# Patient Record
Sex: Male | Born: 1975 | Race: White | Hispanic: No | Marital: Single | Smoking: Former smoker
Health system: Southern US, Community
[De-identification: ages and names within clinical notes are randomized; demographics above are authoritative.]

## PROBLEM LIST (undated history)

## (undated) DIAGNOSIS — E119 Type 2 diabetes mellitus without complications: Secondary | ICD-10-CM

## (undated) HISTORY — PX: KNEE SURGERY: SHX244

## (undated) HISTORY — PX: TONSILLECTOMY: SUR1361

---

## 2009-08-14 ENCOUNTER — Emergency Department (HOSPITAL_COMMUNITY): Admission: EM | Admit: 2009-08-14 | Discharge: 2009-08-14 | Payer: Self-pay | Admitting: Emergency Medicine

## 2011-01-27 LAB — CK TOTAL AND CKMB (NOT AT ARMC)
CK, MB: 2.2 ng/mL (ref 0.3–4.0)
Relative Index: 1.4 (ref 0.0–2.5)

## 2011-01-27 LAB — COMPREHENSIVE METABOLIC PANEL
ALT: 133 U/L — ABNORMAL HIGH (ref 0–53)
Calcium: 10.2 mg/dL (ref 8.4–10.5)
Chloride: 106 mEq/L (ref 96–112)
GFR calc Af Amer: >60 mL/min (ref >60)

## 2011-01-27 LAB — D-DIMER, QUANTITATIVE: D-Dimer, Quant: <0.22 ug/mL-FEU (ref 0.00–0.48)

## 2011-01-27 LAB — CBC
Hemoglobin: 16.4 g/dL (ref 13.0–17.0)
MCHC: 35 g/dL (ref 30.0–36.0)
MCV: 88 fL (ref 78.0–100.0)

## 2011-01-27 LAB — TROPONIN I: Troponin I: <0.01 ng/mL (ref 0.00–0.06)

## 2011-01-27 LAB — DIFFERENTIAL: Eosinophils Relative: 3 % (ref 0–5)

## 2011-02-03 ENCOUNTER — Emergency Department (HOSPITAL_BASED_OUTPATIENT_CLINIC_OR_DEPARTMENT_OTHER)
Admission: EM | Admit: 2011-02-03 | Discharge: 2011-02-03 | Disposition: A | Discharge status: Home / Self Care | Payer: 59 | Attending: Emergency Medicine | Admitting: Emergency Medicine

## 2011-02-03 ENCOUNTER — Emergency Department (INDEPENDENT_AMBULATORY_CARE_PROVIDER_SITE_OTHER): Payer: 59

## 2011-02-03 DIAGNOSIS — R079 Chest pain, unspecified: Secondary | ICD-10-CM

## 2011-02-03 DIAGNOSIS — R0989 Other specified symptoms and signs involving the circulatory and respiratory systems: Secondary | ICD-10-CM | POA: Insufficient documentation

## 2011-02-03 DIAGNOSIS — I10 Essential (primary) hypertension: Secondary | ICD-10-CM | POA: Insufficient documentation

## 2011-02-03 DIAGNOSIS — I517 Cardiomegaly: Secondary | ICD-10-CM | POA: Insufficient documentation

## 2011-02-03 DIAGNOSIS — R0609 Other forms of dyspnea: Secondary | ICD-10-CM | POA: Insufficient documentation

## 2011-02-03 LAB — BASIC METABOLIC PANEL
CO2: 25 mEq/L (ref 19–32)
Chloride: 105 mEq/L (ref 96–112)
GFR calc non Af Amer: >60 mL/min (ref >60)
Glucose, Bld: 115 mg/dL — ABNORMAL HIGH (ref 70–99)
Potassium: 4.5 mEq/L (ref 3.5–5.1)
Sodium: 145 mEq/L (ref 135–145)

## 2011-02-03 LAB — CBC
HCT: 47.8 % (ref 39.0–52.0)
Hemoglobin: 16.8 g/dL (ref 13.0–17.0)
MCH: 29.4 pg (ref 26.0–34.0)
MCHC: 35.1 g/dL (ref 30.0–36.0)
RBC: 5.72 MIL/uL (ref 4.22–5.81)
RDW: 13.9 % (ref 11.5–15.5)
WBC: 6.8 10*3/uL (ref 4.0–10.5)

## 2011-02-03 LAB — DIFFERENTIAL
Basophils Absolute: 0 10*3/uL (ref 0.0–0.1)
Basophils Relative: 0 % (ref 0–1)
Eosinophils Absolute: 0.2 10*3/uL (ref 0.0–0.7)
Lymphocytes Relative: 25 % (ref 12–46)
Lymphs Abs: 1.7 10*3/uL (ref 0.7–4.0)
Monocytes Absolute: 0.6 10*3/uL (ref 0.1–1.0)
Monocytes Relative: 9 % (ref 3–12)
Neutrophils Relative %: 63 % (ref 43–77)

## 2011-02-03 LAB — POCT CARDIAC MARKERS
CKMB, poc: <1 ng/mL — ABNORMAL LOW (ref 1.0–8.0)
Myoglobin, poc: 64.5 ng/mL (ref 12–200)
Myoglobin, poc: 72.6 ng/mL (ref 12–200)
Troponin i, poc: <0.05 ng/mL (ref 0.00–0.09)

## 2011-12-07 IMAGING — CR DG CHEST 2V
2 series · 2 of 2 positions shown · non-contrast
Comparison: 08/14/2009

CLINICAL DATA: Right-sided chest pain radiating into the right arm.
Nonsmoker

CHEST - 2 VIEW

[w chest pa]
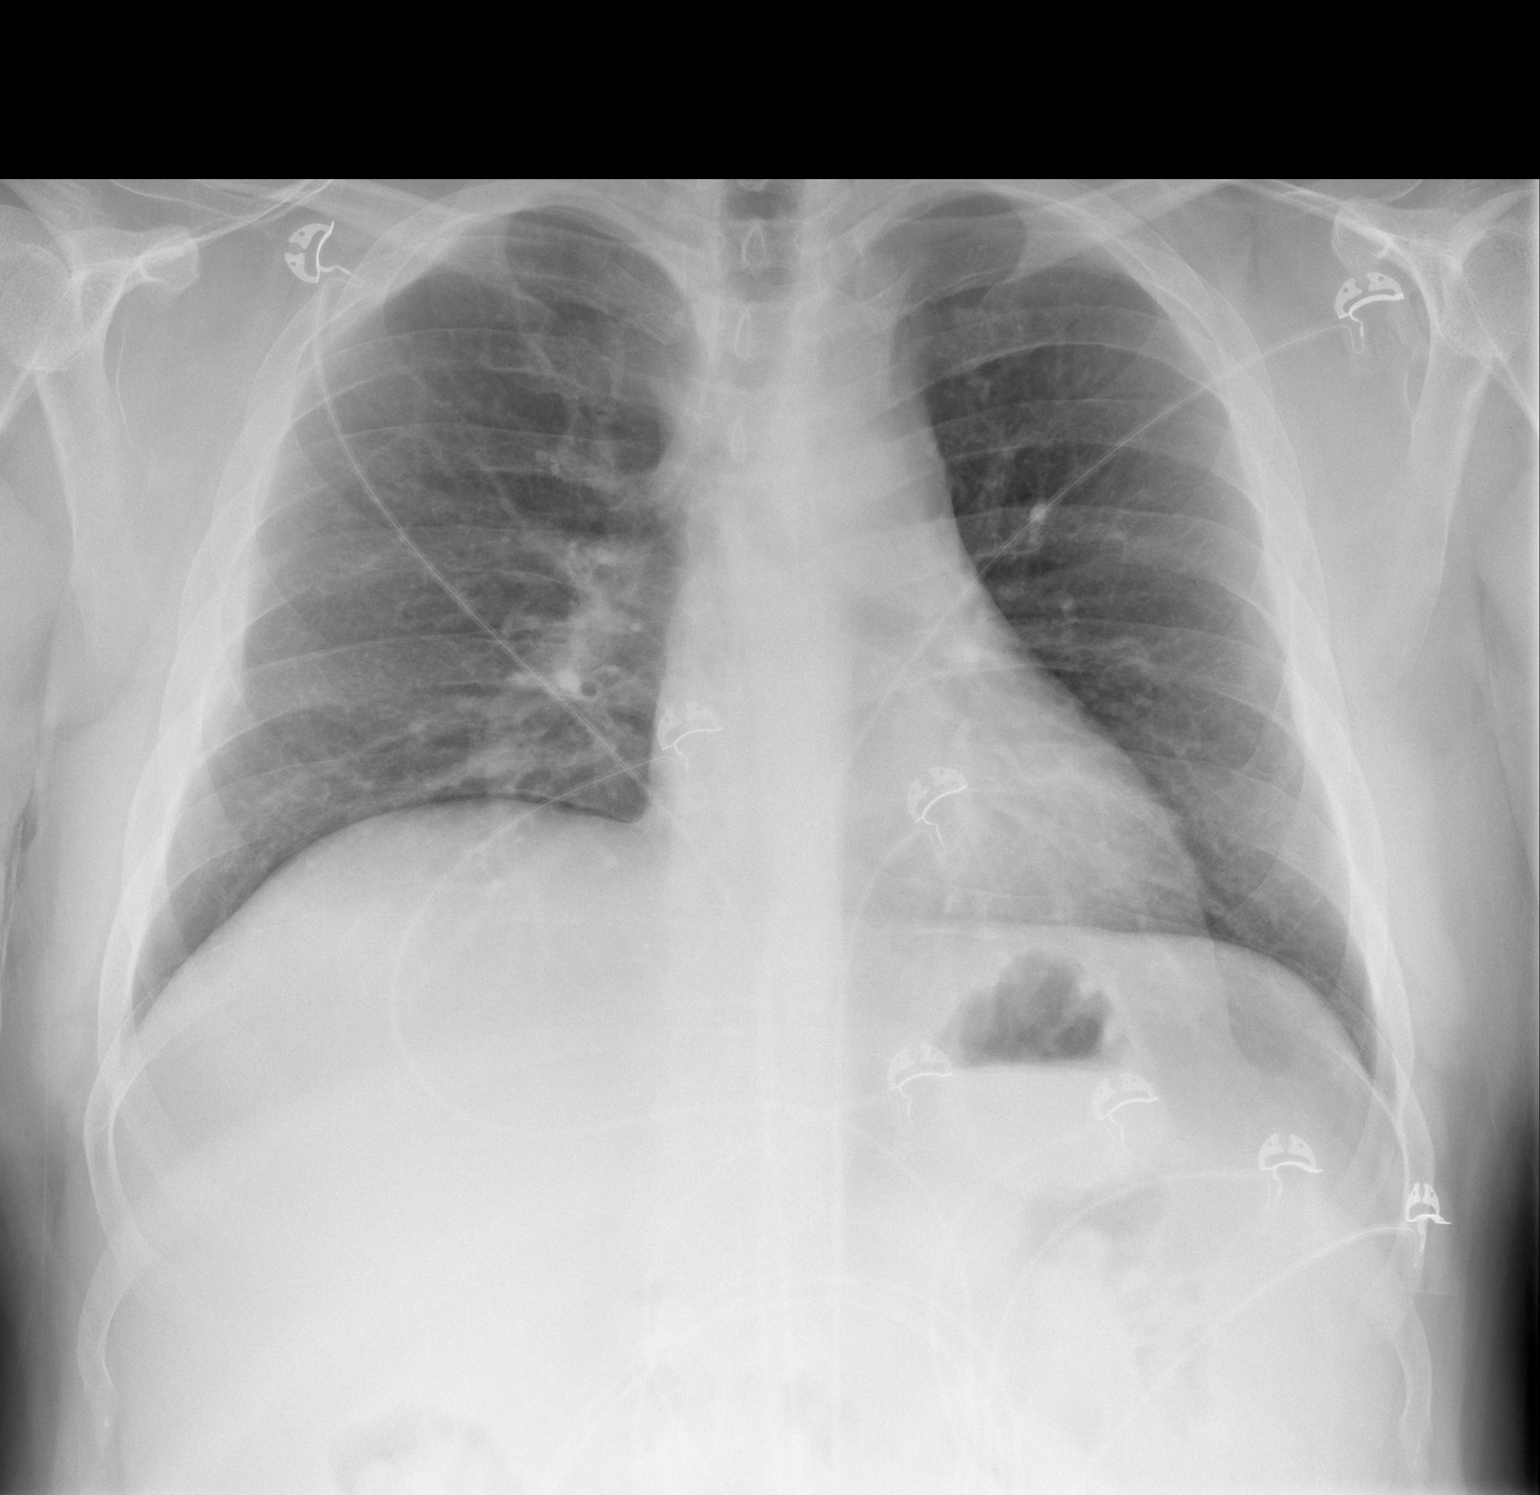

[w chest lat]
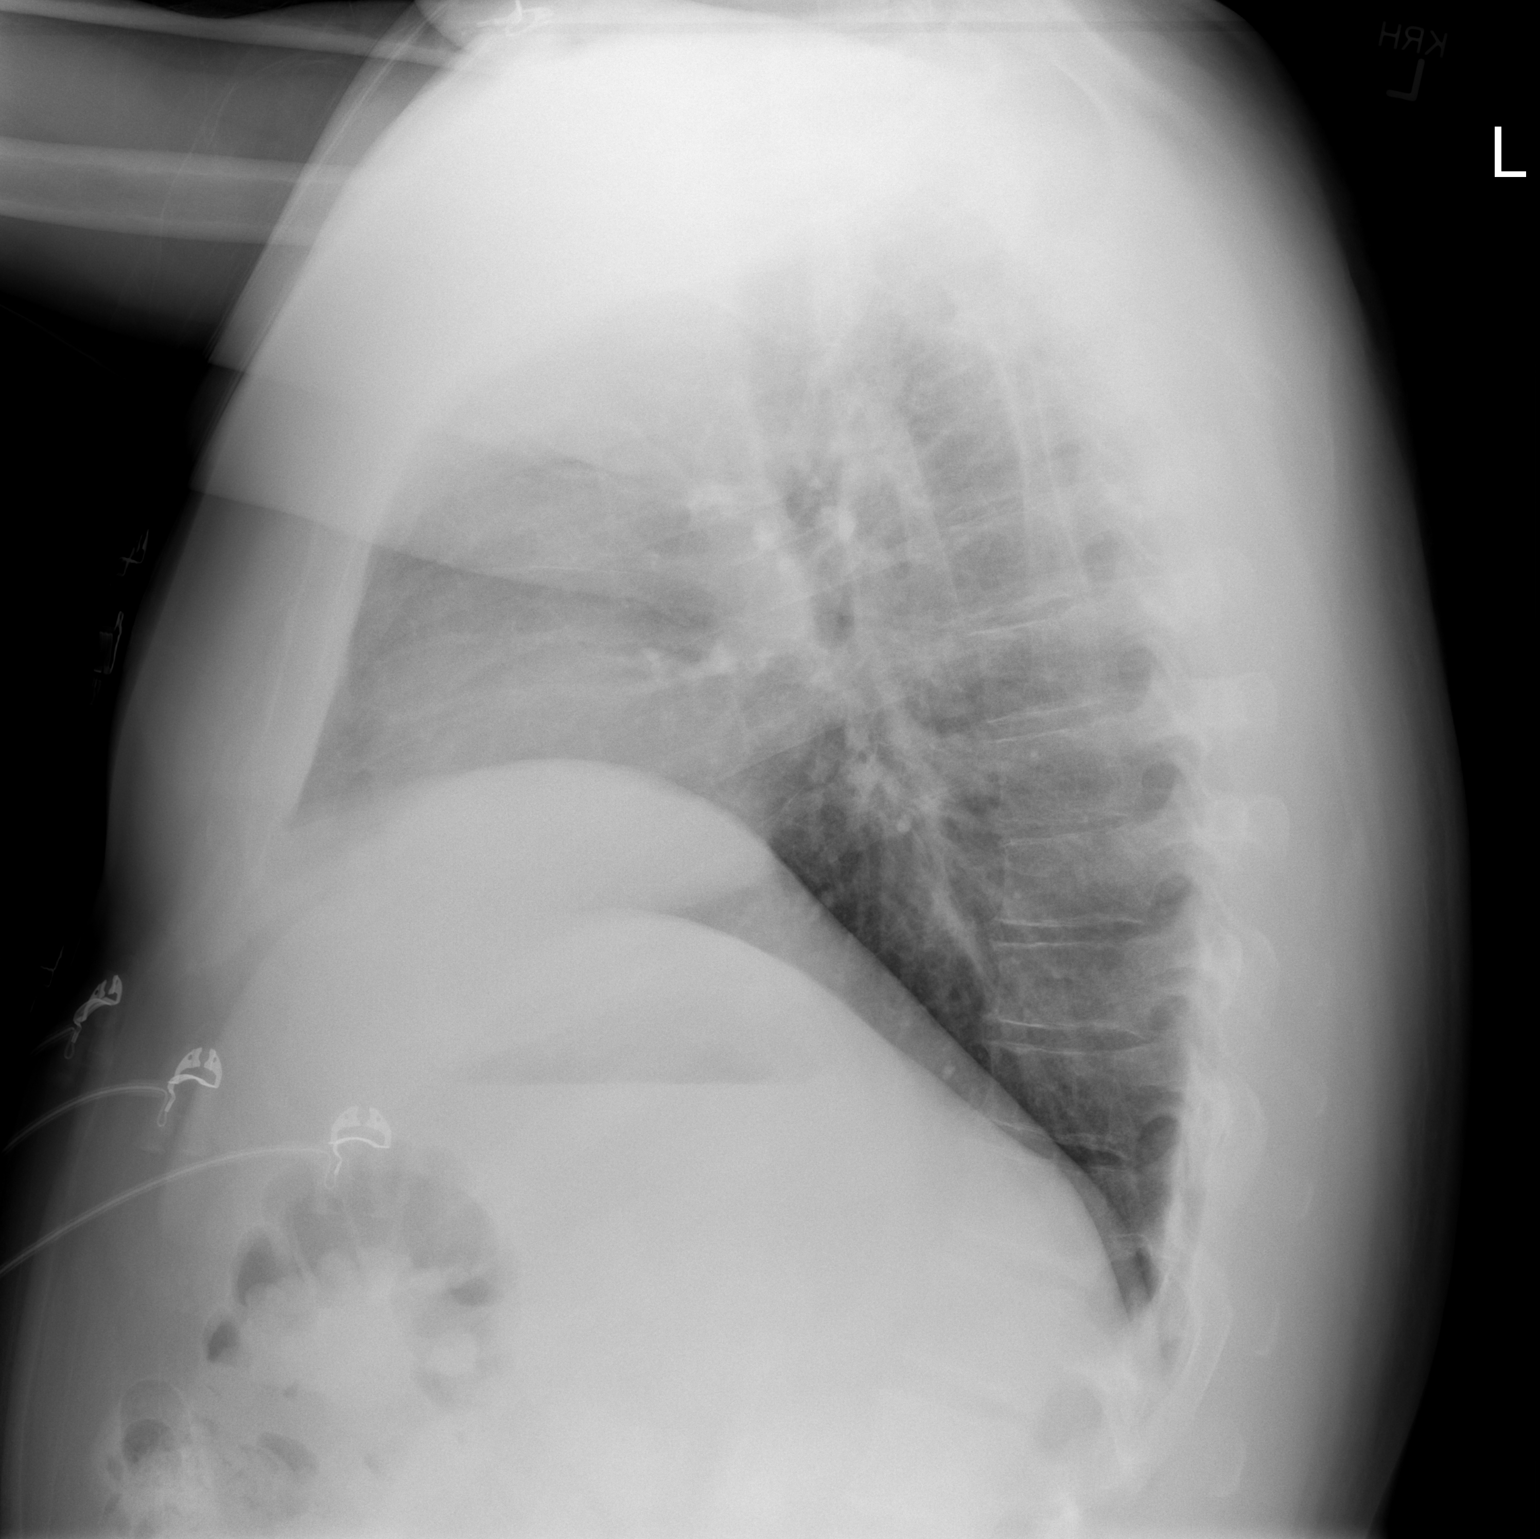

[2 of 2 positions shown; findings below may reference images not displayed]

FINDINGS: Low lung volumes are present and taking this into
consideration heart and mediastinal contours are stable and within
normal limits.

The lung fields are clear with no focal infiltrate or signs of
congestive failure noted.  No pleural fluid or peribronchial
cuffing is noted.

Bony structures are stable.
IMPRESSION: Stable cardiopulmonary appearance with no new focal or acute
abnormality identified.

## 2016-03-05 ENCOUNTER — Encounter (HOSPITAL_BASED_OUTPATIENT_CLINIC_OR_DEPARTMENT_OTHER): Payer: Self-pay | Admitting: *Deleted

## 2016-03-05 ENCOUNTER — Emergency Department (HOSPITAL_BASED_OUTPATIENT_CLINIC_OR_DEPARTMENT_OTHER)
Admission: EM | Admit: 2016-03-05 | Discharge: 2016-03-05 | Disposition: A | Discharge status: Home / Self Care | Payer: 59 | Attending: Emergency Medicine | Admitting: Emergency Medicine

## 2016-03-05 ENCOUNTER — Emergency Department (HOSPITAL_BASED_OUTPATIENT_CLINIC_OR_DEPARTMENT_OTHER): Payer: 59

## 2016-03-05 DIAGNOSIS — R079 Chest pain, unspecified: Secondary | ICD-10-CM | POA: Insufficient documentation

## 2016-03-05 DIAGNOSIS — R42 Dizziness and giddiness: Secondary | ICD-10-CM | POA: Diagnosis not present

## 2016-03-05 DIAGNOSIS — Z87891 Personal history of nicotine dependence: Secondary | ICD-10-CM | POA: Insufficient documentation

## 2016-03-05 DIAGNOSIS — E119 Type 2 diabetes mellitus without complications: Secondary | ICD-10-CM | POA: Diagnosis not present

## 2016-03-05 HISTORY — DX: Type 2 diabetes mellitus without complications: E11.9

## 2016-03-05 LAB — BASIC METABOLIC PANEL
Anion gap: 9 (ref 5–15)
BUN: 13 mg/dL (ref 6–20)
CALCIUM: 9.7 mg/dL (ref 8.9–10.3)
CO2: 26 mmol/L (ref 22–32)
CREATININE: 1.41 mg/dL — AB (ref 0.61–1.24)
Chloride: 105 mmol/L (ref 101–111)
GFR calc Af Amer: >60 mL/min (ref >60)
GFR calc non Af Amer: >60 mL/min (ref >60)
GLUCOSE: 137 mg/dL — AB (ref 65–99)
Potassium: 4.4 mmol/L (ref 3.5–5.1)
Sodium: 140 mmol/L (ref 135–145)

## 2016-03-05 LAB — TROPONIN I

## 2016-03-05 LAB — CBC
HCT: 45.8 % (ref 39.0–52.0)
Hemoglobin: 16.4 g/dL (ref 13.0–17.0)
MCH: 29.7 pg (ref 26.0–34.0)
MCHC: 35.8 g/dL (ref 30.0–36.0)
MCV: 82.8 fL (ref 78.0–100.0)
PLATELETS: 156 10*3/uL (ref 150–400)
RBC: 5.53 MIL/uL (ref 4.22–5.81)
RDW: 13.9 % (ref 11.5–15.5)
WBC: 5.4 10*3/uL (ref 4.0–10.5)

## 2016-03-05 MED ORDER — NITROGLYCERIN 0.4 MG SL SUBL
0.4000 mg | SUBLINGUAL_TABLET | SUBLINGUAL | Status: DC | PRN
  Administered 2016-03-05 (×2): 0.4 mg via SUBLINGUAL
  Filled 2016-03-05: qty 1

## 2016-03-05 MED ORDER — NITROGLYCERIN 0.4 MG SL SUBL
SUBLINGUAL_TABLET | SUBLINGUAL | Status: AC
  Filled 2016-03-05: qty 1

## 2016-03-05 MED ORDER — ASPIRIN 81 MG PO CHEW
324.0000 mg | CHEWABLE_TABLET | Freq: Once | ORAL | Status: AC
  Administered 2016-03-05: 324 mg via ORAL

## 2016-03-05 MED ORDER — ASPIRIN 81 MG PO CHEW
CHEWABLE_TABLET | ORAL | Status: AC
  Filled 2016-03-05: qty 4

## 2016-03-05 NOTE — ED Notes (Signed)
MD at bedside. 

## 2016-03-05 NOTE — ED Notes (Signed)
Pt reports that his chest pressure has gone completely. Pt reports that he doesn't see his PCP for routine care only when he is sick. Discussed the importance of routine checkups. Discussed different parts of blood pressure and what they mean. Pt verbalized understanding in his own words.

## 2016-03-05 NOTE — ED Provider Notes (Signed)
CSN: [id number]     Arrival date & time 03/05/16  1040 History   First MD Initiated Contact with Patient 03/05/16 1053     Chief Complaint  Patient presents with  . Chest Pain     (Consider location/radiation/quality/duration/timing/severity/associated sxs/prior Treatment) HPI Comments: Pt with hx of DM, diet controlled presents with CP.  He works as the Research scientist (life sciences).  He states that he was working in his office and then walked back to the kitchen and picked up a pan. At that point he started having a pain across his chest. It's mostly in the right side that goes across his chest. He describes as a sharp pain that radiates as a tightness across his chest. He also has some pain up into his jaw and tingling into his gums in his fingers. He denies any shortness of breath although he felt he got lightheaded with the pain. There's been no nausea or vomiting. No fevers. No other recent illnesses. No history of past heart disease. No history of hypertension or hyperlipidemia. He has a history of diabetes that has been controlled by diet. He is a former smoker but quit about 6-7 years ago. There is no family history of early heart disease.  Patient is a 40 y.o. male presenting with chest pain.  Chest Pain Associated symptoms: no abdominal pain, no back pain, no cough, no diaphoresis, no dizziness, no fatigue, no fever, no headache, no nausea, no numbness, no shortness of breath, not vomiting and no weakness     Past Medical History  Diagnosis Date  . Diabetes mellitus without complication (HCC)     diet controlled   Past Surgical History  Procedure Laterality Date  . Tonsillectomy    . Knee surgery     No family history on file. Social History  Substance Use Topics  . Smoking status: Former Games developer  . Smokeless tobacco: Never Used  . Alcohol Use: No    Review of Systems  Constitutional: Negative for fever, chills, diaphoresis and fatigue.  HENT: Negative for congestion,  rhinorrhea and sneezing.   Eyes: Negative.   Respiratory: Negative for cough, chest tightness and shortness of breath.   Cardiovascular: Positive for chest pain. Negative for leg swelling.  Gastrointestinal: Negative for nausea, vomiting, abdominal pain, diarrhea and blood in stool.  Genitourinary: Negative for frequency, hematuria, flank pain and difficulty urinating.  Musculoskeletal: Negative for back pain and arthralgias.  Skin: Negative for rash.  Neurological: Positive for light-headedness. Negative for dizziness, speech difficulty, weakness, numbness and headaches.      Allergies  Review of patient's allergies indicates no known allergies.  Home Medications   Prior to Admission medications   Not on File   BP 140/100 mmHg  Pulse 69  Temp(Src) 97.8 F (36.6 C) (Oral)  Resp 24  Ht 6' (1.829 m)  Wt 235 lb (106.595 kg)  BMI 31.86 kg/m2  SpO2 98% Physical Exam  Constitutional: He is oriented to person, place, and time. He appears well-developed and well-nourished.  HENT:  Head: Normocephalic and atraumatic.  Eyes: Pupils are equal, round, and reactive to light.  Neck: Normal range of motion. Neck supple.  Cardiovascular: Normal rate, regular rhythm and normal heart sounds.   Pulmonary/Chest: Effort normal and breath sounds normal. No respiratory distress. He has no wheezes. He has no rales. He exhibits no tenderness.  Abdominal: Soft. Bowel sounds are normal. There is no tenderness. There is no rebound and no guarding.  Musculoskeletal: Normal range of motion.  He exhibits no edema.  No edema or calf tenderness  Lymphadenopathy:    He has no cervical adenopathy.  Neurological: He is alert and oriented to person, place, and time.  Skin: Skin is warm and dry. No rash noted.  Psychiatric: He has a normal mood and affect.    ED Course  Procedures (including critical care time) Labs Review Results for orders placed or performed during the hospital encounter of 03/05/16   Basic metabolic panel  Result Value Ref Range   Sodium 140 135 - 145 mmol/L   Potassium 4.4 3.5 - 5.1 mmol/L   Chloride 105 101 - 111 mmol/L   CO2 26 22 - 32 mmol/L   Glucose, Bld 137 (H) 65 - 99 mg/dL   BUN 13 6 - 20 mg/dL   Creatinine, Ser 9.60 (H) 0.61 - 1.24 mg/dL   Calcium 9.7 8.9 - 45.4 mg/dL   GFR calc non Af Amer >60 >60 mL/min   GFR calc Af Amer >60 >60 mL/min   Anion gap 9 5 - 15  CBC  Result Value Ref Range   WBC 5.4 4.0 - 10.5 K/uL   RBC 5.53 4.22 - 5.81 MIL/uL   Hemoglobin 16.4 13.0 - 17.0 g/dL   HCT 09.8 11.9 - 14.7 %   MCV 82.8 78.0 - 100.0 fL   MCH 29.7 26.0 - 34.0 pg   MCHC 35.8 30.0 - 36.0 g/dL   RDW 82.9 56.2 - 13.0 %   Platelets 156 150 - 400 K/uL  Troponin I  Result Value Ref Range   Troponin I <0.03 <0.031 ng/mL  Troponin I  Result Value Ref Range   Troponin I <0.03 <0.031 ng/mL   Dg Chest 2 View  03/05/2016  CLINICAL DATA:  Right-sided chest pain today. EXAM: CHEST  2 VIEW COMPARISON:  02/03/2011 FINDINGS: Lungs are adequately inflated without consolidation or effusion. Cardiomediastinal silhouette is within normal. Minimal degenerative change of the spine. IMPRESSION: No active cardiopulmonary disease. Electronically Signed   By: Elberta Fortis M.D.   On: 03/05/2016 12:05      Imaging Review Dg Chest 2 View  03/05/2016  CLINICAL DATA:  Right-sided chest pain today. EXAM: CHEST  2 VIEW COMPARISON:  02/03/2011 FINDINGS: Lungs are adequately inflated without consolidation or effusion. Cardiomediastinal silhouette is within normal. Minimal degenerative change of the spine. IMPRESSION: No active cardiopulmonary disease. Electronically Signed   By: Elberta Fortis M.D.   On: 03/05/2016 12:05   I have personally reviewed and evaluated these images and lab results as part of my medical decision-making.   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  Repeat EKG checked and ok.  Will give asa and nitro  Patient presents with right-sided chest pain that radiates across his chest. He has no exertional symptoms. He ambulated around the ED without return of his chest pain. Other than when he first got here, he's had no further episodes of  chest pain. He's had 2 negative troponins. He's had repeat EKGs did not show ischemic changes. He has a heart score of 2. He was discharged home in good condition. He was encouraged to follow-up with his doctor on Monday. He was also given strict return precautions.  Rolan BuccoMelanie Franki Stemen, MD 03/05/16 [phone]

## 2016-03-05 NOTE — ED Notes (Signed)
Patient transported to X-ray 

## 2016-03-05 NOTE — ED Notes (Signed)
Sudden onset of chest pain while working

## 2016-03-05 NOTE — Discharge Instructions (Signed)
Nonspecific Chest Pain  °Chest pain can be caused by many different conditions. There is always a chance that your pain could be related to something serious, such as a heart attack or a blood clot in your lungs. Chest pain can also be caused by conditions that are not life-threatening. If you have chest pain, it is very important to follow up with your health care provider. °CAUSES  °Chest pain can be caused by: °· Heartburn. °· Pneumonia or bronchitis. °· Anxiety or stress. °· Inflammation around your heart (pericarditis) or lung (pleuritis or pleurisy). °· A blood clot in your lung. °· A collapsed lung (pneumothorax). It can develop suddenly on its own (spontaneous pneumothorax) or from trauma to the chest. °· Shingles infection (varicella-zoster virus). °· Heart attack. °· Damage to the bones, muscles, and cartilage that make up your chest wall. This can include: °¨ Bruised bones due to injury. °¨ Strained muscles or cartilage due to frequent or repeated coughing or overwork. °¨ Fracture to one or more ribs. °¨ Sore cartilage due to inflammation (costochondritis). °RISK FACTORS  °Risk factors for chest pain may include: °· Activities that increase your risk for trauma or injury to your chest. °· Respiratory infections or conditions that cause frequent coughing. °· Medical conditions or overeating that can cause heartburn. °· Heart disease or family history of heart disease. °· Conditions or health behaviors that increase your risk of developing a blood clot. °· Having had chicken pox (varicella zoster). °SIGNS AND SYMPTOMS °Chest pain can feel like: °· Burning or tingling on the surface of your chest or deep in your chest. °· Crushing, pressure, aching, or squeezing pain. °· Dull or sharp pain that is worse when you move, cough, or take a deep breath. °· Pain that is also felt in your back, neck, shoulder, or arm, or pain that spreads to any of these areas. °Your chest pain may come and go, or it may stay  constant. °DIAGNOSIS °Lab tests or other studies may be needed to find the cause of your pain. Your health care provider may have you take a test called an ambulatory ECG (electrocardiogram). An ECG records your heartbeat patterns at the time the test is performed. You may also have other tests, such as: °· Transthoracic echocardiogram (TTE). During echocardiography, sound waves are used to create a picture of all of the heart structures and to look at how blood flows through your heart. °· Transesophageal echocardiogram (TEE). This is a more advanced imaging test that obtains images from inside your body. It allows your health care provider to see your heart in finer detail. °· Cardiac monitoring. This allows your health care provider to monitor your heart rate and rhythm in real time. °· Holter monitor. This is a portable device that records your heartbeat and can help to diagnose abnormal heartbeats. It allows your health care provider to track your heart activity for several days, if needed. °· Stress tests. These can be done through exercise or by taking medicine that makes your heart beat more quickly. °· Blood tests. °· Imaging tests. °TREATMENT  °Your treatment depends on what is causing your chest pain. Treatment may include: °· Medicines. These may include: °¨ Acid blockers for heartburn. °¨ Anti-inflammatory medicine. °¨ Pain medicine for inflammatory conditions. °¨ Antibiotic medicine, if an infection is present. °¨ Medicines to dissolve blood clots. °¨ Medicines to treat coronary artery disease. °· Supportive care for conditions that do not require medicines. This may include: °¨ Resting. °¨ Applying heat   or cold packs to injured areas. °¨ Limiting activities until pain decreases. °HOME CARE INSTRUCTIONS °· If you were prescribed an antibiotic medicine, finish it all even if you start to feel better. °· Avoid any activities that bring on chest pain. °· Do not use any tobacco products, including  cigarettes, chewing tobacco, or electronic cigarettes. If you need help quitting, ask your health care provider. °· Do not drink alcohol. °· Take medicines only as directed by your health care provider. °· Keep all follow-up visits as directed by your health care provider. This is important. This includes any further testing if your chest pain does not go away. °· If heartburn is the cause for your chest pain, you may be told to keep your head raised (elevated) while sleeping. This reduces the chance that acid will go from your stomach into your esophagus. °· Make lifestyle changes as directed by your health care provider. These may include: °¨ Getting regular exercise. Ask your health care provider to suggest some activities that are safe for you. °¨ Eating a heart-healthy diet. A registered dietitian can help you to learn healthy eating options. °¨ Maintaining a healthy weight. °¨ Managing diabetes, if necessary. °¨ Reducing stress. °SEEK MEDICAL CARE IF: °· Your chest pain does not go away after treatment. °· You have a rash with blisters on your chest. °· You have a fever. °SEEK IMMEDIATE MEDICAL CARE IF:  °· Your chest pain is worse. °· You have an increasing cough, or you cough up blood. °· You have severe abdominal pain. °· You have severe weakness. °· You faint. °· You have chills. °· You have sudden, unexplained chest discomfort. °· You have sudden, unexplained discomfort in your arms, back, neck, or jaw. °· You have shortness of breath at any time. °· You suddenly start to sweat, or your skin gets clammy. °· You feel nauseous or you vomit. °· You suddenly feel light-headed or dizzy. °· Your heart begins to beat quickly, or it feels like it is skipping beats. °These symptoms may represent a serious problem that is an emergency. Do not wait to see if the symptoms will go away. Get medical help right away. Call your local emergency services (911 in the [place] Do not drive yourself to the hospital. °  °This  information is not intended to replace advice given to you by your health care provider. Make sure you discuss any questions you have with your health care provider. °  °Document Released: 07/20/2005 Document Revised: 10/31/2014 Document Reviewed: 05/16/2014 °[employer] Interactive Patient Education ©2016 [employer] ° °

## 2016-03-05 NOTE — ED Notes (Signed)
Pt taken one ntg tablet, reports HA and lighten of chest pressure.

## 2017-01-06 IMAGING — DX DG CHEST 2V
2 series · 2 of 2 positions shown · non-contrast
Comparison: 02/03/2011

CLINICAL DATA: Right-sided chest pain today.

EXAM:
CHEST  2 VIEW

[chest pa]
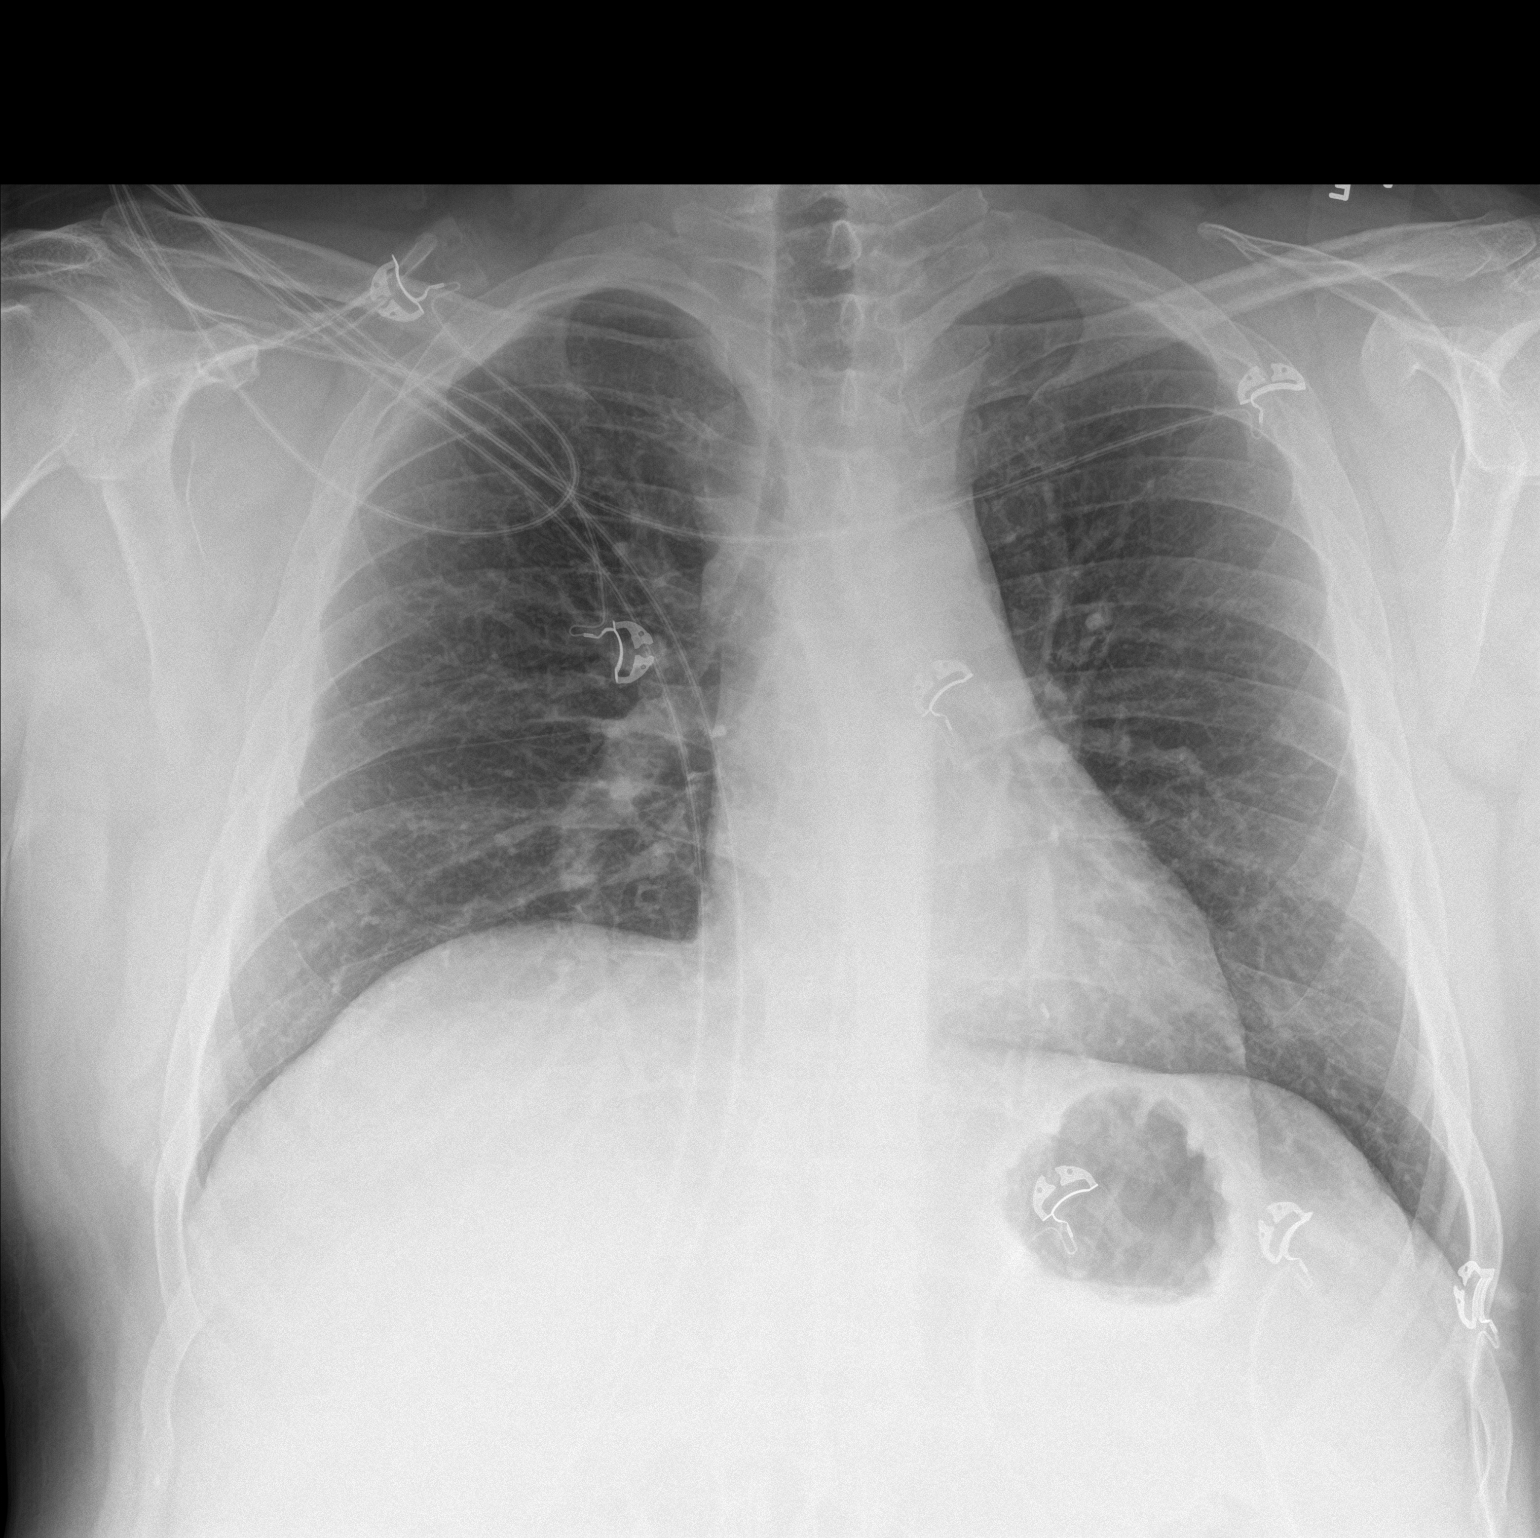

[chest lat]
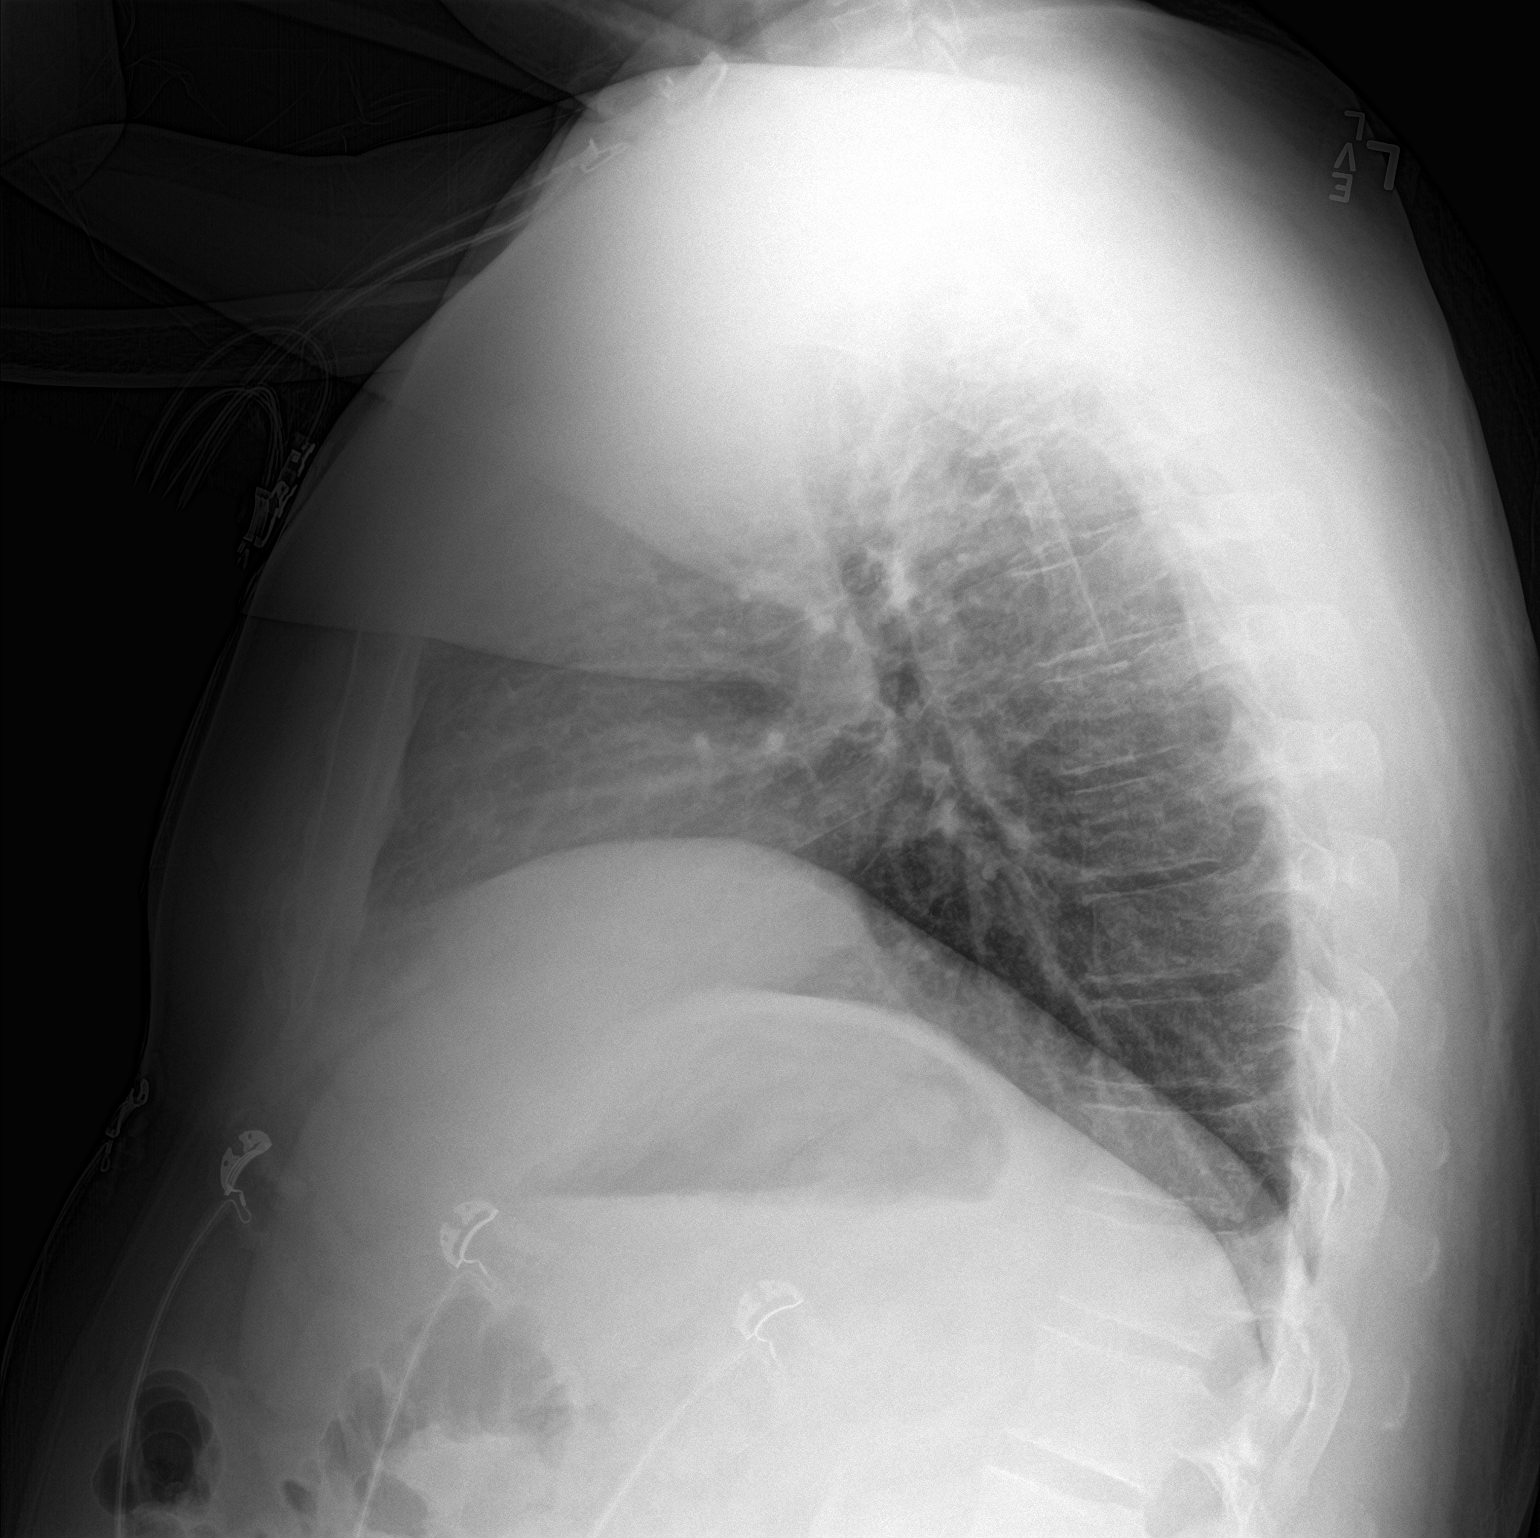

[2 of 2 positions shown; findings below may reference images not displayed]

FINDINGS: Lungs are adequately inflated without consolidation or effusion.
Cardiomediastinal silhouette is within normal. Minimal degenerative
change of the spine.
IMPRESSION: No active cardiopulmonary disease.
